# Patient Record
Sex: Male | Born: 1980 | Race: White | Hispanic: No | Marital: Single | Smoking: Current every day smoker
Health system: Southern US, Community
[De-identification: ages and names within clinical notes are randomized; demographics above are authoritative.]

## PROBLEM LIST (undated history)

## (undated) HISTORY — PX: DENTAL SURGERY: SHX609

---

## 1998-03-12 ENCOUNTER — Inpatient Hospital Stay (HOSPITAL_COMMUNITY): Admission: EM | Admit: 1998-03-12 | Discharge: 1998-03-15 | Payer: Self-pay | Admitting: Emergency Medicine

## 1998-03-15 ENCOUNTER — Encounter (HOSPITAL_COMMUNITY): Admission: RE | Admit: 1998-03-15 | Discharge: 1998-06-13 | Payer: Self-pay | Admitting: Psychiatry

## 2012-12-26 ENCOUNTER — Other Ambulatory Visit: Payer: Self-pay | Admitting: Family Medicine

## 2012-12-26 DIAGNOSIS — R51 Headache: Secondary | ICD-10-CM

## 2013-01-02 ENCOUNTER — Ambulatory Visit
Admission: RE | Admit: 2013-01-02 | Discharge: 2013-01-02 | Disposition: A | Discharge status: Home / Self Care | Payer: No Typology Code available for payment source | Source: Ambulatory Visit | Attending: Family Medicine | Admitting: Family Medicine

## 2013-01-02 DIAGNOSIS — R51 Headache: Secondary | ICD-10-CM

## 2013-01-02 MED ORDER — GADOBENATE DIMEGLUMINE 529 MG/ML IV SOLN
10.0000 mL | Freq: Once | INTRAVENOUS | Status: AC | PRN
  Administered 2013-01-02: 10 mL via INTRAVENOUS

## 2014-06-17 ENCOUNTER — Ambulatory Visit
Admission: RE | Admit: 2014-06-17 | Discharge: 2014-06-17 | Disposition: A | Discharge status: Home / Self Care | Payer: 59 | Source: Ambulatory Visit | Attending: Family Medicine | Admitting: Family Medicine

## 2014-06-17 ENCOUNTER — Other Ambulatory Visit: Payer: Self-pay | Admitting: Family Medicine

## 2014-06-17 DIAGNOSIS — M25511 Pain in right shoulder: Secondary | ICD-10-CM

## 2016-11-20 DIAGNOSIS — E7800 Pure hypercholesterolemia, unspecified: Secondary | ICD-10-CM | POA: Diagnosis not present

## 2016-11-20 DIAGNOSIS — E559 Vitamin D deficiency, unspecified: Secondary | ICD-10-CM | POA: Diagnosis not present

## 2016-11-20 DIAGNOSIS — E291 Testicular hypofunction: Secondary | ICD-10-CM | POA: Diagnosis not present

## 2016-11-20 DIAGNOSIS — E039 Hypothyroidism, unspecified: Secondary | ICD-10-CM | POA: Diagnosis not present

## 2016-11-23 DIAGNOSIS — E559 Vitamin D deficiency, unspecified: Secondary | ICD-10-CM | POA: Diagnosis not present

## 2016-11-23 DIAGNOSIS — E291 Testicular hypofunction: Secondary | ICD-10-CM | POA: Diagnosis not present

## 2016-11-23 DIAGNOSIS — E039 Hypothyroidism, unspecified: Secondary | ICD-10-CM | POA: Diagnosis not present

## 2017-05-28 DIAGNOSIS — E039 Hypothyroidism, unspecified: Secondary | ICD-10-CM | POA: Diagnosis not present

## 2017-05-28 DIAGNOSIS — E291 Testicular hypofunction: Secondary | ICD-10-CM | POA: Diagnosis not present

## 2018-09-30 DIAGNOSIS — E039 Hypothyroidism, unspecified: Secondary | ICD-10-CM | POA: Diagnosis not present

## 2018-09-30 DIAGNOSIS — E291 Testicular hypofunction: Secondary | ICD-10-CM | POA: Diagnosis not present

## 2020-02-19 ENCOUNTER — Other Ambulatory Visit: Payer: 59

## 2020-02-19 ENCOUNTER — Other Ambulatory Visit: Payer: Self-pay

## 2020-02-19 DIAGNOSIS — Z20822 Contact with and (suspected) exposure to covid-19: Secondary | ICD-10-CM

## 2020-02-21 LAB — NOVEL CORONAVIRUS, NAA: SARS-CoV-2, NAA: NOT DETECTED

## 2020-02-21 LAB — SARS-COV-2, NAA 2 DAY TAT

## 2020-06-25 ENCOUNTER — Other Ambulatory Visit: Payer: Self-pay

## 2020-06-25 ENCOUNTER — Ambulatory Visit: Payer: 59 | Admitting: Family Medicine

## 2020-11-02 ENCOUNTER — Emergency Department (HOSPITAL_COMMUNITY): Payer: 59

## 2020-11-02 ENCOUNTER — Encounter (HOSPITAL_COMMUNITY): Payer: Self-pay | Admitting: Emergency Medicine

## 2020-11-02 ENCOUNTER — Emergency Department (HOSPITAL_COMMUNITY)
Admission: EM | Admit: 2020-11-02 | Discharge: 2020-11-03 | Disposition: A | Discharge status: Home / Self Care | Payer: 59 | Attending: Emergency Medicine | Admitting: Emergency Medicine

## 2020-11-02 ENCOUNTER — Other Ambulatory Visit: Payer: Self-pay

## 2020-11-02 DIAGNOSIS — S42401A Unspecified fracture of lower end of right humerus, initial encounter for closed fracture: Secondary | ICD-10-CM | POA: Insufficient documentation

## 2020-11-02 DIAGNOSIS — S42491A Other displaced fracture of lower end of right humerus, initial encounter for closed fracture: Secondary | ICD-10-CM

## 2020-11-02 DIAGNOSIS — X58XXXA Exposure to other specified factors, initial encounter: Secondary | ICD-10-CM | POA: Insufficient documentation

## 2020-11-02 DIAGNOSIS — Y9372 Activity, wrestling: Secondary | ICD-10-CM | POA: Insufficient documentation

## 2020-11-02 DIAGNOSIS — S4991XA Unspecified injury of right shoulder and upper arm, initial encounter: Secondary | ICD-10-CM | POA: Diagnosis present

## 2020-11-02 MED ORDER — OXYCODONE-ACETAMINOPHEN 5-325 MG PO TABS
2.0000 | ORAL_TABLET | Freq: Once | ORAL | Status: AC
  Administered 2020-11-02: 2 via ORAL
  Filled 2020-11-02: qty 2

## 2020-11-02 MED ORDER — HYDROMORPHONE HCL 1 MG/ML IJ SOLN
1.0000 mg | Freq: Once | INTRAMUSCULAR | Status: AC
  Administered 2020-11-03: 1 mg via INTRAVENOUS
  Filled 2020-11-02: qty 1

## 2020-11-02 MED ORDER — ONDANSETRON HCL 4 MG/2ML IJ SOLN
4.0000 mg | Freq: Once | INTRAMUSCULAR | Status: AC
  Administered 2020-11-03: 4 mg via INTRAVENOUS
  Filled 2020-11-02: qty 2

## 2020-11-02 NOTE — ED Provider Notes (Signed)
Emergency Medicine Provider Triage Evaluation Note  Shane Middleton , a 40 y.o. male  was evaluated in triage.  Pt complains of right arm pain.  States that he broke his arm about 30 minutes ago while arm wrestling.  States the pain is in his upper arm.  Reports associated numbness and tingling.  Review of Systems  Positive: Arm pain, numbness, tingling Negative: weakness  Physical Exam  Ht 5\' 11"  (1.803 m)   Wt 92 kg   BMI 28.29 kg/m  Gen:   Awake, no distress   Resp:  Normal effort  MSK:   No visible deformity, right upper extremity TTP Other:  Intact distal pulses, intact distal sensation, brisk cap refill  Medical Decision Making  Medically screening exam initiated at 10:17 PM.  Appropriate orders placed.  was informed that the remainder of the evaluation will be completed by another provider, this initial triage assessment does not replace that evaluation, and the importance of remaining in the ED until their evaluation is complete.  X-ray, pain meds, and sling ordered.   Lahoma Crocker, PA-C 11/02/20 2219    2220, MD 11/03/20 2258

## 2020-11-02 NOTE — ED Triage Notes (Signed)
Patient reports injury to right upper arm this evening while arm wrestling with pain and swelling .

## 2020-11-02 NOTE — ED Notes (Signed)
Paged [facility] by Margo Aye

## 2020-11-02 NOTE — ED Provider Notes (Signed)
[facility] EMERGENCY DEPARTMENT Provider Note   CSN: [id number] Arrival date & time: 11/02/20  2211     History Chief Complaint  Patient presents with  . Arm Injury    Shane Middleton is a 40 y.o. male.  Patient presents to the ED with a chief complaint of right arm pain.  He states that he was arm wrestling tonight and felt a snap and thinks he broke his arm.  He complains of numbness and tingling.  He rates the pain as severe. He states the pain is worsened with palpation and movement.  Denies any treatments PTA.  The history is provided by the patient. No language interpreter was used.       History reviewed. No pertinent past medical history.  There are no problems to display for this patient.   History reviewed. No pertinent surgical history.     No family history on file.  Social History   Tobacco Use  . Smoking status: Never Smoker  . Smokeless tobacco: Never Used  Substance Use Topics  . Alcohol use: Never  . Drug use: Never    Home Medications Prior to Admission medications   Not on File    Allergies    Patient has no known allergies.  Review of Systems   Review of Systems  All other systems reviewed and are negative.   Physical Exam Updated Vital Signs BP (!) 141/82   Pulse 84   Temp (!) 97.3 F (36.3 C) (Oral)   Resp 16   Ht 5\' 11"  (1.803 m)   Wt 92 kg   SpO2 100%   BMI 28.29 kg/m   Physical Exam Nursing note and vitals reviewed.  Constitutional: Pt appears well-developed and well-nourished. No distress.  HENT:  Head: Normocephalic and atraumatic.  Eyes: Conjunctivae are normal.  Neck: Normal range of motion.  Cardiovascular: Normal rate, regular rhythm. Intact distal pulses.   Capillary refill < 3 sec.  Pulmonary/Chest: Effort normal and breath sounds normal.  Musculoskeletal:  RUE Pt exhibits TTP over the humerus.   ROM: deferred  Strength: deferred  Neurological: Pt  is alert. Coordination normal.   Sensation: 5/5 Skin: Skin is warm and dry. Pt is not diaphoretic.  No evidence of open wound or skin tenting Psychiatric: Pt has a normal mood and affect.    ED Results / Procedures / Treatments   Labs (all labs ordered are listed, but only abnormal results are displayed) Labs Reviewed - No data to display  EKG None  Radiology DG Humerus Right  Result Date: 11/02/2020 CLINICAL DATA:  Arm pain and deformity after wrestling tonight. EXAM: RIGHT HUMERUS - 2+ VIEW COMPARISON:  None. FINDINGS: Oblique fracture of the distal humeral shaft with 2.2 cm osseous displacement. No evidence of intra-articular extension at the elbow joint. Intact proximal humerus. No obvious elbow joint effusion, patient had difficulty with positioning due to pain. Soft tissue edema at the fracture site. IMPRESSION: Displaced distal humeral shaft fracture. Electronically Signed   By: 11/04/2020 M.D.   On: 11/02/2020 22:58    Procedures Procedures   Medications Ordered in ED Medications  HYDROmorphone (DILAUDID) injection 1 mg (has no administration in time range)  ondansetron (ZOFRAN) injection 4 mg (has no administration in time range)  oxyCODONE-acetaminophen (PERCOCET/ROXICET) 5-325 MG per tablet 2 tablet (2 tablets Oral Given 11/02/20 2224)    ED Course  I have reviewed the triage vital signs and the nursing notes.  Pertinent labs & imaging results that  were available during my care of the patient were reviewed by me and considered in my medical decision making (see chart for details).    MDM Rules/Calculators/A&P                          Patient presents with injury to right arm.  DDx includes, fracture, strain, or sprain.  Consultants: Dr. Aundria Rud, recommends long arm splint, sling, and elbow films.  Call office and make an appointment.  Plain films reveal right oblique humerus fx.  Pt advised to follow up with PCP and/or orthopedics. Patient given sling and splint while in ED, conservative  therapy such as RICE recommended and discussed.   Patient will be discharged home & is agreeable with above plan. Returns precautions discussed. Pt appears safe for discharge.  Final Clinical Impression(s) / ED Diagnoses Final diagnoses:  Other closed displaced fracture of distal end of right humerus, initial encounter    Rx / DC Orders ED Discharge Orders         Ordered    oxyCODONE-acetaminophen (PERCOCET) 5-325 MG tablet  Every 4 hours PRN        11/03/20 0107    ondansetron (ZOFRAN ODT) 4 MG disintegrating tablet  Every 8 hours PRN        11/03/20 0107           Roxy Horseman, PA-C 11/03/20 0107    Mesner, Barbara Cower, MD 11/03/20 0200

## 2020-11-03 MED ORDER — ONDANSETRON 4 MG PO TBDP: 4.0000 mg | ORAL_TABLET | Freq: Three times a day (TID) | ORAL | 0 refills | Status: AC | PRN

## 2020-11-03 MED ORDER — HYDROMORPHONE HCL 1 MG/ML IJ SOLN
1.0000 mg | Freq: Once | INTRAMUSCULAR | Status: AC
Start: 2020-11-03 — End: 2020-11-03
  Administered 2020-11-03: 1 mg via INTRAVENOUS
  Filled 2020-11-03: qty 1

## 2020-11-03 MED ORDER — OXYCODONE-ACETAMINOPHEN 5-325 MG PO TABS: 1.0000 | ORAL_TABLET | ORAL | 0 refills | Status: DC | PRN

## 2020-11-03 NOTE — Discharge Instructions (Signed)
Call Dr. Aundria Rud' office tomorrow and let them know that he said you needed an appointment for your broken humerus.    Take pain medications as prescribed.

## 2020-11-03 NOTE — Progress Notes (Addendum)
Orthopedic Tech Progress Note Patient Details:  Shane Middleton January 23, 1981 [id number]  Ortho Devices Type of Ortho Device: Arm sling,Post (long arm) splint Ortho Device/Splint Location: rue Ortho Device/Splint Interventions: Ordered,Application,Adjustment  pt already had arm sling on Post Interventions Patient Tolerated: Well Instructions Provided: Care of device,Adjustment of device   Trinna Post 11/03/2020, 1:04 AM

## 2020-11-04 ENCOUNTER — Other Ambulatory Visit: Payer: Self-pay

## 2020-11-04 ENCOUNTER — Encounter (HOSPITAL_COMMUNITY): Payer: Self-pay | Admitting: Orthopedic Surgery

## 2020-11-04 NOTE — Progress Notes (Signed)
PCP - [facility] - Dr. Tenny Craw Cardiologist -   Chest x-ray -  EKG -  Stress Test -  ECHO -  Cardiac Cath -    ERAS Protcol - clears 0930  COVID TEST- n/a  Anesthesia review: n/a  -------------  SDW INSTRUCTIONS:  Your procedure is scheduled on 5/27 Friday. Please report to [facility] Main Entrance "A" at 10:00 A.M., and check in at the Admitting office. Call this number if you have problems the morning of surgery: [phone]   Remember: Do not eat after midnight the night before your surgery  You may drink clear liquids until 09:30 AM the morning of your surgery.   Clear liquids allowed are: Water, Non-Citrus Juices (without pulp), Carbonated Beverages, Clear Tea, Black Coffee Only, and Gatorade   Medications to take morning of surgery with a sip of water include: anastrozole (ARIMIDEX) buPROPion (WELLBUTRIN XL) ondansetron (ZOFRAN ODT) if needed oxyCODONE-acetaminophen (PERCOCET) if needed thyroid (ARMOUR)    As of today, STOP taking any Aspirin (unless otherwise instructed by your surgeon), Aleve, Naproxen, Ibuprofen, Motrin, Advil, Goody's, BC's, all herbal medications, fish oil, and all vitamins.    The Morning of Surgery Do not wear jewelry Do not wear lotions, powders, colognes, or deodorant Men may shave face and neck. Do not bring valuables to the hospital. [facility] is not responsible for any belongings or valuables. If you are a smoker, DO NOT Smoke 24 hours prior to surgery If you wear a CPAP at night please bring your mask the morning of surgery  Remember that you must have someone to transport you home after your surgery, and remain with you for 24 hours if you are discharged the same day. Please bring cases for contacts, glasses, hearing aids, dentures or bridgework because it cannot be worn into surgery.   Patients discharged the day of surgery will not be allowed to drive home.   Please shower the NIGHT BEFORE SURGERY and the MORNING OF  SURGERY with DIAL Soap. Wear comfortable clothes the morning of surgery. Oral Hygiene is also important to reduce your risk of infection.  Remember - BRUSH YOUR TEETH THE MORNING OF SURGERY WITH YOUR REGULAR TOOTHPASTE  Patient denies shortness of breath, fever, cough and chest pain.

## 2020-11-05 ENCOUNTER — Other Ambulatory Visit: Payer: Self-pay

## 2020-11-05 ENCOUNTER — Other Ambulatory Visit (HOSPITAL_COMMUNITY): Payer: Self-pay | Admitting: Orthopedic Surgery

## 2020-11-05 ENCOUNTER — Ambulatory Visit (HOSPITAL_COMMUNITY): Payer: 59 | Admitting: Anesthesiology

## 2020-11-05 ENCOUNTER — Ambulatory Visit (HOSPITAL_COMMUNITY): Payer: 59

## 2020-11-05 ENCOUNTER — Encounter (HOSPITAL_COMMUNITY)
Admission: RE | Disposition: A | Discharge status: Home / Self Care | Payer: Self-pay | Source: Home / Self Care | Attending: Orthopedic Surgery

## 2020-11-05 ENCOUNTER — Ambulatory Visit (HOSPITAL_COMMUNITY)
Admission: RE | Admit: 2020-11-05 | Discharge: 2020-11-05 | Disposition: A | Discharge status: Home / Self Care | Payer: 59 | Attending: Orthopedic Surgery | Admitting: Orthopedic Surgery

## 2020-11-05 ENCOUNTER — Encounter (HOSPITAL_COMMUNITY): Payer: Self-pay | Admitting: Orthopedic Surgery

## 2020-11-05 DIAGNOSIS — Y9372 Activity, wrestling: Secondary | ICD-10-CM | POA: Insufficient documentation

## 2020-11-05 DIAGNOSIS — T148XXA Other injury of unspecified body region, initial encounter: Secondary | ICD-10-CM

## 2020-11-05 DIAGNOSIS — Z79899 Other long term (current) drug therapy: Secondary | ICD-10-CM | POA: Insufficient documentation

## 2020-11-05 DIAGNOSIS — Z79890 Hormone replacement therapy (postmenopausal): Secondary | ICD-10-CM | POA: Diagnosis not present

## 2020-11-05 DIAGNOSIS — Z419 Encounter for procedure for purposes other than remedying health state, unspecified: Secondary | ICD-10-CM

## 2020-11-05 DIAGNOSIS — F17210 Nicotine dependence, cigarettes, uncomplicated: Secondary | ICD-10-CM | POA: Insufficient documentation

## 2020-11-05 DIAGNOSIS — S42401A Unspecified fracture of lower end of right humerus, initial encounter for closed fracture: Secondary | ICD-10-CM | POA: Insufficient documentation

## 2020-11-05 HISTORY — PX: ORIF HUMERUS FRACTURE: SHX2126

## 2020-11-05 SURGERY — OPEN REDUCTION INTERNAL FIXATION (ORIF) HUMERAL SHAFT FRACTURE
Anesthesia: General | Site: Arm Upper | Laterality: Right

## 2020-11-05 MED ORDER — METHOCARBAMOL 500 MG PO TABS: 500.0000 mg | ORAL_TABLET | Freq: Four times a day (QID) | ORAL | 1 refills | Status: AC | PRN

## 2020-11-05 MED ORDER — MIDAZOLAM HCL 2 MG/2ML IJ SOLN
INTRAMUSCULAR | Status: AC
  Filled 2020-11-05: qty 2

## 2020-11-05 MED ORDER — VANCOMYCIN HCL 500 MG IV SOLR
INTRAVENOUS | Status: AC
  Filled 2020-11-05: qty 500

## 2020-11-05 MED ORDER — DEXMEDETOMIDINE (PRECEDEX) IN NS 20 MCG/5ML (4 MCG/ML) IV SYRINGE
PREFILLED_SYRINGE | INTRAVENOUS | Status: AC
  Filled 2020-11-05: qty 10

## 2020-11-05 MED ORDER — LACTATED RINGERS IV SOLN: INTRAVENOUS | Status: DC

## 2020-11-05 MED ORDER — MIDAZOLAM HCL 2 MG/2ML IJ SOLN
INTRAMUSCULAR | Status: DC | PRN
  Administered 2020-11-05: 2 mg via INTRAVENOUS

## 2020-11-05 MED ORDER — ONDANSETRON HCL 4 MG/2ML IJ SOLN
INTRAMUSCULAR | Status: DC | PRN
  Administered 2020-11-05: 4 mg via INTRAVENOUS

## 2020-11-05 MED ORDER — ACETAMINOPHEN 325 MG PO TABS: 325.0000 mg | ORAL_TABLET | Freq: Once | ORAL | Status: DC | PRN

## 2020-11-05 MED ORDER — FENTANYL CITRATE (PF) 100 MCG/2ML IJ SOLN: 100.0000 ug | Freq: Once | INTRAMUSCULAR | Status: AC

## 2020-11-05 MED ORDER — FENTANYL CITRATE (PF) 250 MCG/5ML IJ SOLN
INTRAMUSCULAR | Status: DC | PRN
  Administered 2020-11-05: 50 ug via INTRAVENOUS
  Administered 2020-11-05: 100 ug via INTRAVENOUS
  Administered 2020-11-05: 50 ug via INTRAVENOUS

## 2020-11-05 MED ORDER — ACETAMINOPHEN 10 MG/ML IV SOLN: 1000.0000 mg | Freq: Once | INTRAVENOUS | Status: DC | PRN

## 2020-11-05 MED ORDER — ONDANSETRON HCL 4 MG/2ML IJ SOLN
INTRAMUSCULAR | Status: AC
  Filled 2020-11-05: qty 2

## 2020-11-05 MED ORDER — DEXMEDETOMIDINE (PRECEDEX) IN NS 20 MCG/5ML (4 MCG/ML) IV SYRINGE
PREFILLED_SYRINGE | INTRAVENOUS | Status: DC | PRN
  Administered 2020-11-05 (×2): 12 ug via INTRAVENOUS
  Administered 2020-11-05 (×2): 8 ug via INTRAVENOUS

## 2020-11-05 MED ORDER — FENTANYL CITRATE (PF) 250 MCG/5ML IJ SOLN
INTRAMUSCULAR | Status: AC
  Filled 2020-11-05: qty 5

## 2020-11-05 MED ORDER — DEXAMETHASONE SODIUM PHOSPHATE 10 MG/ML IJ SOLN
INTRAMUSCULAR | Status: DC | PRN
  Administered 2020-11-05: 10 mg via INTRAVENOUS

## 2020-11-05 MED ORDER — AMISULPRIDE (ANTIEMETIC) 5 MG/2ML IV SOLN: 10.0000 mg | Freq: Once | INTRAVENOUS | Status: DC | PRN

## 2020-11-05 MED ORDER — VANCOMYCIN HCL 1000 MG IV SOLR
INTRAVENOUS | Status: AC
  Filled 2020-11-05: qty 1000

## 2020-11-05 MED ORDER — DEXAMETHASONE SODIUM PHOSPHATE 10 MG/ML IJ SOLN
INTRAMUSCULAR | Status: AC
  Filled 2020-11-05: qty 1

## 2020-11-05 MED ORDER — ONDANSETRON 4 MG PO TBDP: 4.0000 mg | ORAL_TABLET | Freq: Three times a day (TID) | ORAL | 0 refills | Status: AC | PRN

## 2020-11-05 MED ORDER — PHENYLEPHRINE 40 MCG/ML (10ML) SYRINGE FOR IV PUSH (FOR BLOOD PRESSURE SUPPORT)
PREFILLED_SYRINGE | INTRAVENOUS | Status: DC | PRN
  Administered 2020-11-05 (×2): 40 ug via INTRAVENOUS

## 2020-11-05 MED ORDER — FENTANYL CITRATE (PF) 100 MCG/2ML IJ SOLN
INTRAMUSCULAR | Status: AC
  Administered 2020-11-05: 100 ug via INTRAVENOUS
  Filled 2020-11-05: qty 2

## 2020-11-05 MED ORDER — ROCURONIUM BROMIDE 10 MG/ML (PF) SYRINGE
PREFILLED_SYRINGE | INTRAVENOUS | Status: DC | PRN
  Administered 2020-11-05: 60 mg via INTRAVENOUS
  Administered 2020-11-05: 10 mg via INTRAVENOUS

## 2020-11-05 MED ORDER — VANCOMYCIN HCL 1000 MG IV SOLR
INTRAVENOUS | Status: DC | PRN
  Administered 2020-11-05: 1000 mg

## 2020-11-05 MED ORDER — ACETAMINOPHEN 160 MG/5ML PO SOLN
325.0000 mg | Freq: Once | ORAL | Status: DC | PRN
Start: 2020-11-05 — End: 2020-11-07

## 2020-11-05 MED ORDER — BUPIVACAINE LIPOSOME 1.3 % IJ SUSP
INTRAMUSCULAR | Status: DC | PRN
  Administered 2020-11-05: 10 mL via PERINEURAL

## 2020-11-05 MED ORDER — ORAL CARE MOUTH RINSE: 15.0000 mL | Freq: Once | OROMUCOSAL | Status: AC

## 2020-11-05 MED ORDER — LIDOCAINE 2% (20 MG/ML) 5 ML SYRINGE
INTRAMUSCULAR | Status: DC | PRN
  Administered 2020-11-05: 40 mg via INTRAVENOUS

## 2020-11-05 MED ORDER — HYDROMORPHONE HCL 1 MG/ML IJ SOLN: 0.2500 mg | INTRAMUSCULAR | Status: DC | PRN

## 2020-11-05 MED ORDER — HYDROCODONE-ACETAMINOPHEN 7.5-325 MG PO TABS: 1.0000 | ORAL_TABLET | Freq: Four times a day (QID) | ORAL | 0 refills | Status: AC | PRN

## 2020-11-05 MED ORDER — ROCURONIUM BROMIDE 10 MG/ML (PF) SYRINGE
PREFILLED_SYRINGE | INTRAVENOUS | Status: AC
  Filled 2020-11-05: qty 10

## 2020-11-05 MED ORDER — MEPERIDINE HCL 25 MG/ML IJ SOLN: 6.2500 mg | INTRAMUSCULAR | Status: DC | PRN

## 2020-11-05 MED ORDER — LIDOCAINE 2% (20 MG/ML) 5 ML SYRINGE
INTRAMUSCULAR | Status: AC
  Filled 2020-11-05: qty 10

## 2020-11-05 MED ORDER — SUGAMMADEX SODIUM 200 MG/2ML IV SOLN
INTRAVENOUS | Status: DC | PRN
  Administered 2020-11-05: 200 mg via INTRAVENOUS

## 2020-11-05 MED ORDER — PROPOFOL 10 MG/ML IV BOLUS
INTRAVENOUS | Status: AC
  Filled 2020-11-05: qty 20

## 2020-11-05 MED ORDER — BUPIVACAINE HCL (PF) 0.5 % IJ SOLN
INTRAMUSCULAR | Status: DC | PRN
  Administered 2020-11-05: 15 mL via PERINEURAL

## 2020-11-05 MED ORDER — CEFAZOLIN SODIUM-DEXTROSE 2-4 GM/100ML-% IV SOLN
2.0000 g | INTRAVENOUS | Status: AC
Start: 2020-11-05 — End: 2020-11-05
  Administered 2020-11-05: 2 g via INTRAVENOUS
  Filled 2020-11-05: qty 100

## 2020-11-05 MED ORDER — CHLORHEXIDINE GLUCONATE 0.12 % MT SOLN
15.0000 mL | Freq: Once | OROMUCOSAL | Status: AC
  Administered 2020-11-05: 15 mL via OROMUCOSAL
  Filled 2020-11-05: qty 15

## 2020-11-05 MED ORDER — TRANEXAMIC ACID-NACL 1000-0.7 MG/100ML-% IV SOLN
1000.0000 mg | INTRAVENOUS | Status: AC
  Administered 2020-11-05: 1000 mg via INTRAVENOUS
  Filled 2020-11-05: qty 100

## 2020-11-05 MED ORDER — 0.9 % SODIUM CHLORIDE (POUR BTL) OPTIME
TOPICAL | Status: DC | PRN
  Administered 2020-11-05: 1000 mL

## 2020-11-05 MED ORDER — MIDAZOLAM HCL 2 MG/2ML IJ SOLN
INTRAMUSCULAR | Status: AC
  Administered 2020-11-05: 2 mg via INTRAVENOUS
  Filled 2020-11-05: qty 2

## 2020-11-05 MED ORDER — PROMETHAZINE HCL 25 MG/ML IJ SOLN: 6.2500 mg | INTRAMUSCULAR | Status: DC | PRN

## 2020-11-05 MED ORDER — MIDAZOLAM HCL 2 MG/2ML IJ SOLN: 2.0000 mg | Freq: Once | INTRAMUSCULAR | Status: AC

## 2020-11-05 SURGICAL SUPPLY — 67 items
BIT DRILL QC 2X140 (BIT) ×2 IMPLANT
BIT DRILL QC SFS 2.5X170 (BIT) ×2 IMPLANT
BNDG ELASTIC 4X5.8 VLCR STR LF (GAUZE/BANDAGES/DRESSINGS) ×2 IMPLANT
BNDG ELASTIC 6X5.8 VLCR STR LF (GAUZE/BANDAGES/DRESSINGS) ×2 IMPLANT
COVER SURGICAL LIGHT HANDLE (MISCELLANEOUS) ×2 IMPLANT
COVER WAND RF STERILE (DRAPES) ×2 IMPLANT
DRAPE C-ARM 42X72 X-RAY (DRAPES) ×2 IMPLANT
DRAPE INCISE IOBAN 66X45 STRL (DRAPES) ×2 IMPLANT
DRAPE ORTHO SPLIT 77X108 STRL (DRAPES) ×4
DRAPE SURG ORHT 6 SPLT 77X108 (DRAPES) ×2 IMPLANT
DRAPE U-SHAPE 47X51 STRL (DRAPES) ×2 IMPLANT
DRSG AQUACEL AG ADV 3.5X 4 (GAUZE/BANDAGES/DRESSINGS) IMPLANT
DRSG AQUACEL AG ADV 3.5X 6 (GAUZE/BANDAGES/DRESSINGS) IMPLANT
DRSG PAD ABDOMINAL 8X10 ST (GAUZE/BANDAGES/DRESSINGS) ×2 IMPLANT
DURAPREP 26ML APPLICATOR (WOUND CARE) ×2 IMPLANT
ELECT CAUTERY BLADE 6.4 (BLADE) ×2 IMPLANT
ELECT REM PT RETURN 9FT ADLT (ELECTROSURGICAL) ×2
ELECTRODE REM PT RTRN 9FT ADLT (ELECTROSURGICAL) ×1 IMPLANT
GAUZE SPONGE 4X4 12PLY STRL (GAUZE/BANDAGES/DRESSINGS) ×2 IMPLANT
GLOVE BIO SURGEON STRL SZ7.5 (GLOVE) ×4 IMPLANT
GLOVE SRG 8 PF TXTR STRL LF DI (GLOVE) ×2 IMPLANT
GLOVE SURG UNDER POLY LF SZ8 (GLOVE) ×4
GOWN STRL REUS W/ TWL LRG LVL3 (GOWN DISPOSABLE) ×1 IMPLANT
GOWN STRL REUS W/ TWL XL LVL3 (GOWN DISPOSABLE) ×2 IMPLANT
GOWN STRL REUS W/TWL LRG LVL3 (GOWN DISPOSABLE) ×2
GOWN STRL REUS W/TWL XL LVL3 (GOWN DISPOSABLE) ×4
K-WIRE 1.6X150 (WIRE) ×2
KIT BASIN OR (CUSTOM PROCEDURE TRAY) ×2 IMPLANT
KIT TURNOVER KIT B (KITS) ×2 IMPLANT
KWIRE 1.6X150 (WIRE) ×1 IMPLANT
MANIFOLD NEPTUNE II (INSTRUMENTS) ×2 IMPLANT
NS IRRIG 1000ML POUR BTL (IV SOLUTION) ×2 IMPLANT
PACK SHOULDER (CUSTOM PROCEDURE TRAY) ×2 IMPLANT
PAD ARMBOARD 7.5X6 YLW CONV (MISCELLANEOUS) ×4 IMPLANT
PAD CAST 3X4 CTTN HI CHSV (CAST SUPPLIES) ×1 IMPLANT
PADDING CAST COTTON 3X4 STRL (CAST SUPPLIES) ×2
PROS LCP PLATE 12 163M (Plate) ×2 IMPLANT
PROSTHESIS LCP PLATE 12 163M (Plate) ×1 IMPLANT
SCREW CORTEX 3.5 24MM (Screw) ×1 IMPLANT
SCREW CORTEX 3.5 26MM (Screw) ×2 IMPLANT
SCREW CORTEX 3.5 28MM (Screw) ×3 IMPLANT
SCREW CORTEX 3.5 30MM (Screw) ×3 IMPLANT
SCREW LOCK CORT ST 3.5X24 (Screw) ×1 IMPLANT
SCREW LOCK CORT ST 3.5X26 (Screw) ×2 IMPLANT
SCREW LOCK CORT ST 3.5X28 (Screw) ×3 IMPLANT
SCREW LOCK CORT ST 3.5X30 (Screw) ×3 IMPLANT
SCREW METAPHYSEAL 2.7X24MM (Screw) ×2 IMPLANT
SCREW SELF-TAP 2.7X26MM (Screw) ×4 IMPLANT
SLING ARM FOAM STRAP LRG (SOFTGOODS) IMPLANT
SLING ARM FOAM STRAP MED (SOFTGOODS) IMPLANT
SPONGE LAP 18X18 RF (DISPOSABLE) IMPLANT
SPONGE LAP 4X18 RFD (DISPOSABLE) IMPLANT
STRIP CLOSURE SKIN 1/2X4 (GAUZE/BANDAGES/DRESSINGS) ×2 IMPLANT
SUCTION FRAZIER HANDLE 10FR (MISCELLANEOUS) ×2
SUCTION TUBE FRAZIER 10FR DISP (MISCELLANEOUS) ×1 IMPLANT
SUT FIBERWIRE #2 38 T-5 BLUE (SUTURE)
SUT MNCRL AB 3-0 PS2 18 (SUTURE) ×2 IMPLANT
SUT MNCRL AB 4-0 PS2 18 (SUTURE) IMPLANT
SUT VIC AB 2-0 CT1 27 (SUTURE) ×6
SUT VIC AB 2-0 CT1 TAPERPNT 27 (SUTURE) ×3 IMPLANT
SUTURE FIBERWR #2 38 T-5 BLUE (SUTURE) IMPLANT
SYR BULB IRRIG 60ML STRL (SYRINGE) ×2 IMPLANT
SYR CONTROL 10ML LL (SYRINGE) IMPLANT
TOWEL GREEN STERILE (TOWEL DISPOSABLE) ×2 IMPLANT
TOWEL GREEN STERILE FF (TOWEL DISPOSABLE) ×2 IMPLANT
WATER STERILE IRR 1000ML POUR (IV SOLUTION) ×2 IMPLANT
YANKAUER SUCT BULB TIP NO VENT (SUCTIONS) ×2 IMPLANT

## 2020-11-05 NOTE — Brief Op Note (Signed)
11/05/2020  2:46 PM  PATIENT:  Lahoma Crocker  40 y.o. male  PRE-OPERATIVE DIAGNOSIS:  Right humberus shaft fracture  POST-OPERATIVE DIAGNOSIS:  Right humberus shaft fracture  PROCEDURE:  Procedure(s) with comments: OPEN REDUCTION INTERNAL FIXATION (ORIF) HUMERAL SHAFT FRACTURE (Right) - 2 hrs  SURGEON:  Surgeon(s) and Role:    * Aundria Rud, Noah Delaine, MD - Primary    * Haddix, Gillie Manners, MD - Assisting  PHYSICIAN ASSISTANT: Dion Saucier, PA-C  ANESTHESIA:   regional and general  EBL:  50 mL   BLOOD ADMINISTERED:none  DRAINS: none   LOCAL MEDICATIONS USED:  NONE  SPECIMEN:  No Specimen  DISPOSITION OF SPECIMEN:  N/A  COUNTS:  YES  TOURNIQUET:  * No tourniquets in log *  DICTATION: .Note written in EPIC  PLAN OF CARE: Discharge to home after PACU  PATIENT DISPOSITION:  PACU - hemodynamically stable.   Delay start of Pharmacological VTE agent (>24hrs) due to surgical blood loss or risk of bleeding: not applicable

## 2020-11-05 NOTE — Op Note (Addendum)
11/05/2020  2:53 PM  PATIENT:  Shane Middleton    PRE-OPERATIVE DIAGNOSIS:  Right humerus shaft fracture  POST-OPERATIVE DIAGNOSIS:  Same  PROCEDURE:  OPEN REDUCTION INTERNAL FIXATION (ORIF) HUMERAL SHAFT FRACTURE  SURGEON:  Yolonda Kida, MD  PHYSICIAN ASSISTANT: Dion Saucier, PA-C, present and scrubbed throughout the case, critical for completion in a timely fashion, and for retraction, instrumentation, and closure.  ANESTHESIA:   General  PREOPERATIVE INDICATIONS:  Shane Middleton is a  40 y.o. male with a diagnosis of Right humerus shaft fracture who elected for surgical management.    The risks benefits and alternatives were discussed with the patient including but not limited to the risks of nonoperative treatment, versus surgical intervention including infection, bleeding, nerve injury, malunion, nonunion, the need for revision surgery, hardware prominence, hardware failure, the need for hardware removal, blood clots, cardiopulmonary complications, conversion to arthroplasty, morbidity, mortality, among others, and they were willing to proceed.  Predicted outcome is good, although there will be at least a six to nine month expected recovery.   OPERATIVE IMPLANTS:  Synthes 3.5 LCP plate 12 hole 3 metaphyseal 2.7 mm interfragment screws  OPERATIVE FINDINGS: Displaced distal one third humerus  UNIQUE ASPECTS OF THE CASE:   Significant swelling and intramuscular hematoma of the brachialis.  This was a simple fracture pattern with a short oblique segment in the distal third of the humeral shaft.  This was extra-articular.  Bone quality was good.  OPERATIVE PROCEDURE: The patient was brought to the operating room and placed in the supine position. General anesthesia was administered. IV antibiotics were given. He was placed with the right upper extremity on the arm table.  This was a radiolucent table.  We began with a anterior lateral approach to the humeral shaft.  Skin  incision was taken down just lateral to the midline.  Dissection carried down to the level of the biceps fascia.  The cephalic vein was dissected and transposed the lateral.  We then entered the deep space between the bicep muscle fascia and brachialis muscle fascia.  The bicep was retracted medially with blunt retractors.  The musculocutaneous nerve was noted traversing along the undersurface of the biceps muscle.  This was protected throughout the case.    We then divided the brachialis in line with muscle fibers at the junction of the middle third and lateral third.  This exposed the fracture site.  I exposed the fracture site, and placed deep retractors.  Fracture hematoma was gently debrided with saline lavage and finger dissection.  Bone holding clamps were placed proximal and distal.  Reduction maneuver was undertaken and 2 point-to-point clamps were used to hold the reduction provisionally.  We then used intraoperative fluoroscopy to confirm adequate reduction of the spiral fracture.  Next, we placed 3 interfragmentary screws noting good compression with our clamps.  These were placed sequentially with 2.7 mm metaphyseal screws.  These had excellent purchase.  We then moved to select a plate placed anteriorly along this distal humerus fracture.  We chose a 12 hole 3.5 mm LCP plate.  We used fluoroscopy on the AP view to angle the plate into the lateral column distal so that we can get bicortical screws there and avoid the olecranon fossa.  Once we had adequate placement of the plate we then began to place our screws.  We placed 4 distal screws to the fracture site which were all bicortical, 3.5 millimeter screws with good compression.  Proximally, we placed a 5 bicortical 3.5  mm cortical screws.  Final x-rays were taken intraoperatively with fluoroscopy.  AP and lateral.  These confirm adequate reduction and placement of intraoperative hardware.  The wound was then copiously irrigated with normal  saline.  The biceps was allowed to slide lateral and cover the deep plate.  Fascial closure was not undertaken to allow for pretty moderate swelling.  We then closed the skin with 2-0 Vicryl and 3-0 subcuticular Monocryl and Steri-Strips.  Standard sterile dressing was applied and the arm was placed in a sling.  No intraoperative complications were encountered.  Patient was awakened from general anesthetic in stable condition and transported to PACU.  All counts were correct x2.   Disposition:  Mr. Hollibaugh will be nonweightbearing to the right upper extremity for at least 6 weeks in regards to heavy lifting.  He may begin activities of daily living with the right arm starting now.  Sling will be worn for the first 2 weeks.  He can begin elbow, hand, and wrist range of motion as able.  Discharge home today from PACU.  Follow-up with me in 2 weeks with 2 x-ray views of the right humerus.

## 2020-11-05 NOTE — Discharge Instructions (Signed)
Orthopedic discharge instructions:  -Maintain postoperative bandages for 3 days.  He may remove these on the third day and begin showering.  He should then replace a dry dressing over your incision and wrapped back up with the Ace wrap until your follow-up appointment in 2 weeks.  Change the dressing once per day after the third day.  -Maintain your arm in the sling at all times unless doing elbow range of motion and/or shoulder range of motion and/or hand and wrist range of motion.  No lifting more than 1 pound with the right arm at this time.  -Apply ice to the right arm along the biceps area for 20 to 30 minutes out of each hour.  Do this around-the-clock as frequently as possible throughout the day.  -For mild to moderate pain you can use Tylenol and Advil in alternating fashion around-the-clock.  You should then use your hydrocodone as needed for breakthrough pain.  -Follow-up with Dr. Aundria Rud in 2 weeks.

## 2020-11-05 NOTE — Anesthesia Procedure Notes (Signed)
Procedure Name: Intubation Date/Time: 11/05/2020 12:57 PM Performed by: Marena Chancy, CRNA Pre-anesthesia Checklist: Patient identified, Emergency Drugs available, Suction available and Patient being monitored Patient Re-evaluated:Patient Re-evaluated prior to induction Oxygen Delivery Method: Circle System Utilized Preoxygenation: Pre-oxygenation with 100% oxygen Induction Type: IV induction Ventilation: Mask ventilation with difficulty, Two handed mask ventilation required and Oral airway inserted - appropriate to patient size Laryngoscope Size: Hyacinth Meeker and 2 Grade View: Grade I Tube type: Oral Tube size: 7.5 mm Number of attempts: 1 Airway Equipment and Method: Stylet and Oral airway Placement Confirmation: ETT inserted through vocal cords under direct vision,  positive ETCO2 and breath sounds checked- equal and bilateral Tube secured with: Tape Dental Injury: Teeth and Oropharynx as per pre-operative assessment  Comments: Good view with a substantial head lift and slight cricoid pressure

## 2020-11-05 NOTE — Anesthesia Postprocedure Evaluation (Signed)
Anesthesia Post Note  Patient: Shane Middleton  Procedure(s) Performed: OPEN REDUCTION INTERNAL FIXATION (ORIF) HUMERAL SHAFT FRACTURE (Right Arm Upper)     Patient location during evaluation: PACU Anesthesia Type: General Level of consciousness: awake and alert, patient cooperative and oriented Pain management: pain level controlled Vital Signs Assessment: post-procedure vital signs reviewed and stable Respiratory status: spontaneous breathing, nonlabored ventilation and respiratory function stable Cardiovascular status: blood pressure returned to baseline and stable Postop Assessment: no apparent nausea or vomiting, able to ambulate and adequate PO intake Anesthetic complications: no   No complications documented.  Last Vitals:  Vitals:   11/05/20 1540 11/05/20 1555  BP: (!) 166/80 (!) 154/86  Pulse: 88 85  Resp: (!) 21 (!) 2  Temp:  (!) 36.2 C  SpO2: 94% 94%    Last Pain:  Vitals:   11/05/20 1555  TempSrc:   PainSc: 0-No pain                 Adan Baehr,E. Harlis Champoux

## 2020-11-05 NOTE — Transfer of Care (Signed)
Immediate Anesthesia Transfer of Care Note  Patient: Shane Middleton  Procedure(s) Performed: OPEN REDUCTION INTERNAL FIXATION (ORIF) HUMERAL SHAFT FRACTURE (Right Arm Upper)  Patient Location: PACU  Anesthesia Type:GA combined with regional for post-op pain  Level of Consciousness: drowsy  Airway & Oxygen Therapy: Patient Spontanous Breathing  Post-op Assessment: Report given to RN and Post -op Vital signs reviewed and stable  Post vital signs: Reviewed and stable  Last Vitals:  Vitals Value Taken Time  BP    Temp    Pulse 90 11/05/20 1527  Resp 18 11/05/20 1527  SpO2 93 % 11/05/20 1527  Vitals shown include unvalidated device data.  Last Pain:  Vitals:   11/05/20 1008  TempSrc: Oral         Complications: No complications documented.

## 2020-11-05 NOTE — Anesthesia Preprocedure Evaluation (Addendum)
Anesthesia Evaluation  Patient identified by MRN, date of birth, ID band Patient awake    Reviewed: Allergy & Precautions, NPO status , Patient's Chart, lab work & pertinent test results  Airway Mallampati: II  TM Distance: >3 FB Neck ROM: Full    Dental  (+) Teeth Intact, Dental Advisory Given   Pulmonary Current SmokerPatient did not abstain from smoking.,    breath sounds clear to auscultation       Cardiovascular negative cardio ROS   Rhythm:Regular Rate:Normal     Neuro/Psych negative neurological ROS  negative psych ROS   GI/Hepatic negative GI ROS, Neg liver ROS,   Endo/Other  negative endocrine ROS  Renal/GU negative Renal ROS     Musculoskeletal negative musculoskeletal ROS (+)   Abdominal Normal abdominal exam  (+)   Peds  Hematology negative hematology ROS (+)   Anesthesia Other Findings   Reproductive/Obstetrics                            Anesthesia Physical Anesthesia Plan  ASA: II  Anesthesia Plan: General   Post-op Pain Management: GA combined w/ Regional for post-op pain   Induction: Intravenous  PONV Risk Score and Plan: 2 and Ondansetron, Dexamethasone and Midazolam  Airway Management Planned: Oral ETT  Additional Equipment: None  Intra-op Plan:   Post-operative Plan: Extubation in OR  Informed Consent: I have reviewed the patients History and Physical, chart, labs and discussed the procedure including the risks, benefits and alternatives for the proposed anesthesia with the patient or authorized representative who has indicated his/her understanding and acceptance.     Dental advisory given  Plan Discussed with: CRNA  Anesthesia Plan Comments:        Anesthesia Quick Evaluation

## 2020-11-05 NOTE — Anesthesia Procedure Notes (Signed)
Anesthesia Regional Block: Interscalene brachial plexus block   Pre-Anesthetic Checklist: ,, timeout performed, Correct Patient, Correct Site, Correct Laterality, Correct Procedure, Correct Position, site marked, Risks and benefits discussed,  Surgical consent,  Pre-op evaluation,  At surgeon's request and post-op pain management  Laterality: Right  Prep: chloraprep       Needles:  Injection technique: Single-shot  Needle Type: Echogenic Stimulator Needle     Needle Length: 9cm  Needle Gauge: 21     Additional Needles:   Procedures:,,,, ultrasound used (permanent image in chart),,,,  Narrative:  Start time: 11/05/2020 11:25 AM End time: 11/05/2020 11:30 AM Injection made incrementally with aspirations every 5 mL.  Performed by: Personally  Anesthesiologist: Shelton Silvas, MD  Additional Notes: Patient tolerated the procedure well. Local anesthetic introduced in an incremental fashion under minimal resistance after negative aspirations. No paresthesias were elicited. After completion of the procedure, no acute issues were identified and patient continued to be monitored by RN.
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
## 2020-11-09 ENCOUNTER — Encounter (HOSPITAL_COMMUNITY): Payer: Self-pay | Admitting: Orthopedic Surgery

## 2021-09-17 IMAGING — CR DG HUMERUS 2V *R*
2 series · 2 of 2 positions shown · non-contrast
Comparison: None.

CLINICAL DATA: Arm pain and deformity after wrestling tonight.

EXAM:
RIGHT HUMERUS - 2+ VIEW

[humerus ap]
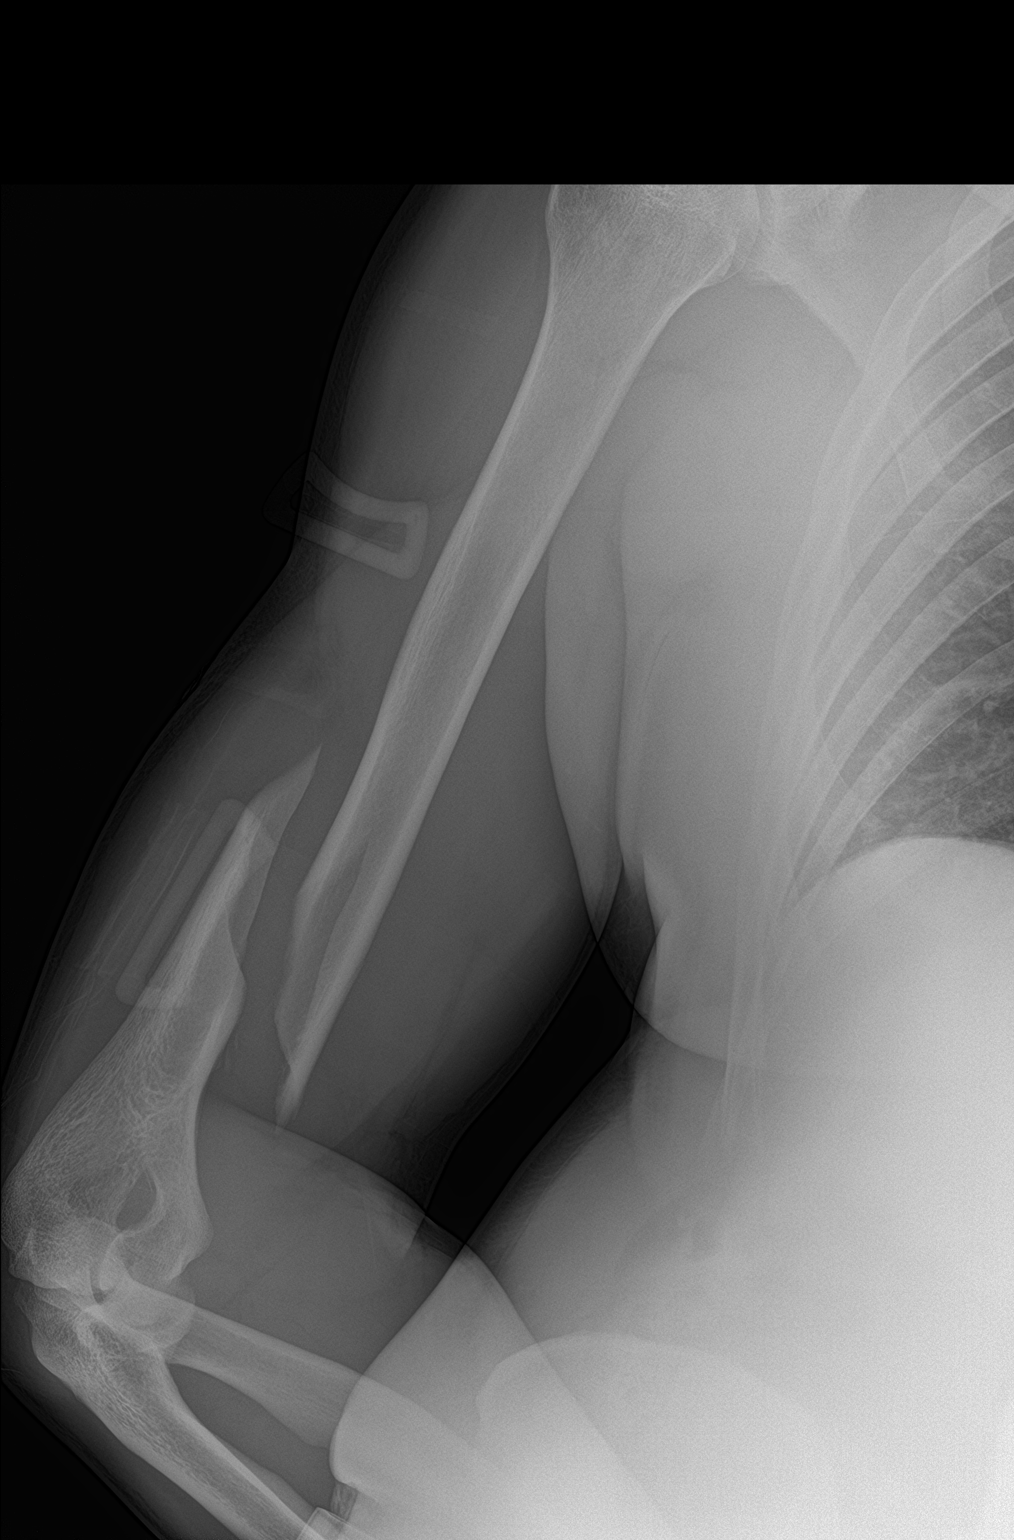

[humerus lat]
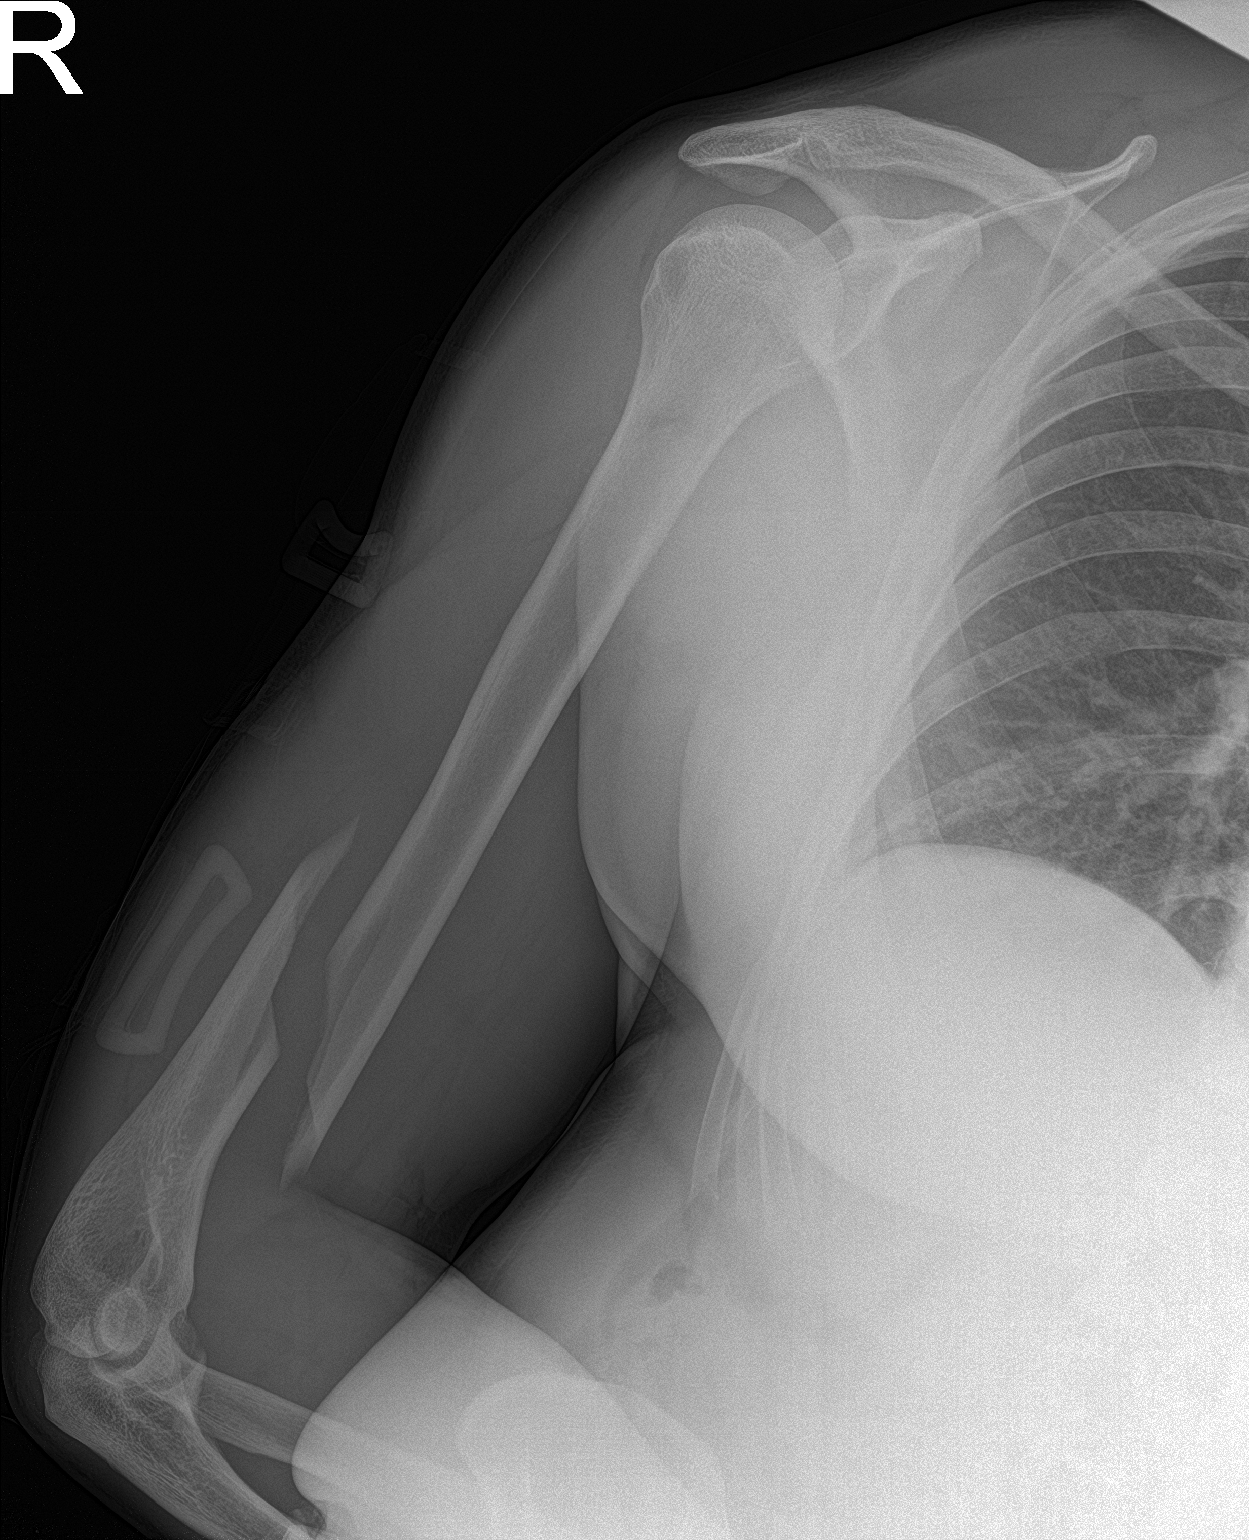

[2 of 2 positions shown; findings below may reference images not displayed]

FINDINGS: Oblique fracture of the distal humeral shaft with 2.2 cm osseous
displacement. No evidence of intra-articular extension at the elbow
joint. Intact proximal humerus. No obvious elbow joint effusion,
patient had difficulty with positioning due to pain. Soft tissue
edema at the fracture site.
IMPRESSION: Displaced distal humeral shaft fracture.

## 2021-09-20 IMAGING — RF DG C-ARM 1-60 MIN
1 series · 6 of 6 positions shown · non-contrast
Comparison: Preoperative radiograph 11/02/2020

CLINICAL DATA: ORIF right humerus.

EXAM:
RIGHT HUMERUS - 2+ VIEW; DG C-ARM 1-60 MIN

[Series 1: run · 6 of 6 slices shown]
[im 1/6]
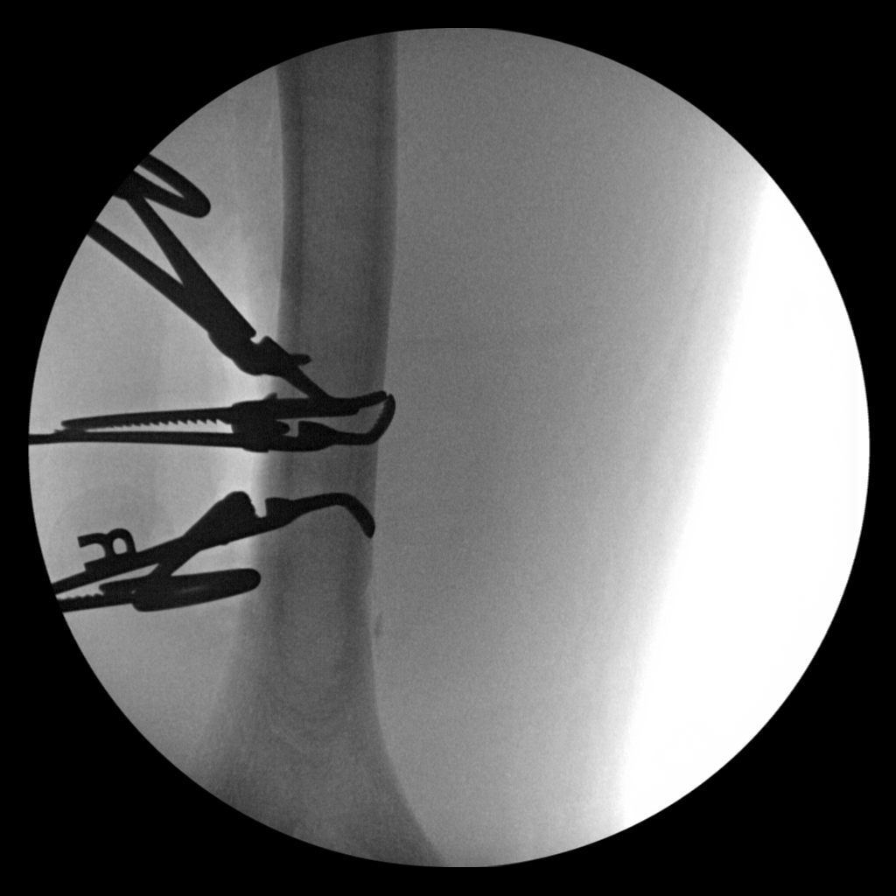
[im 2/6]
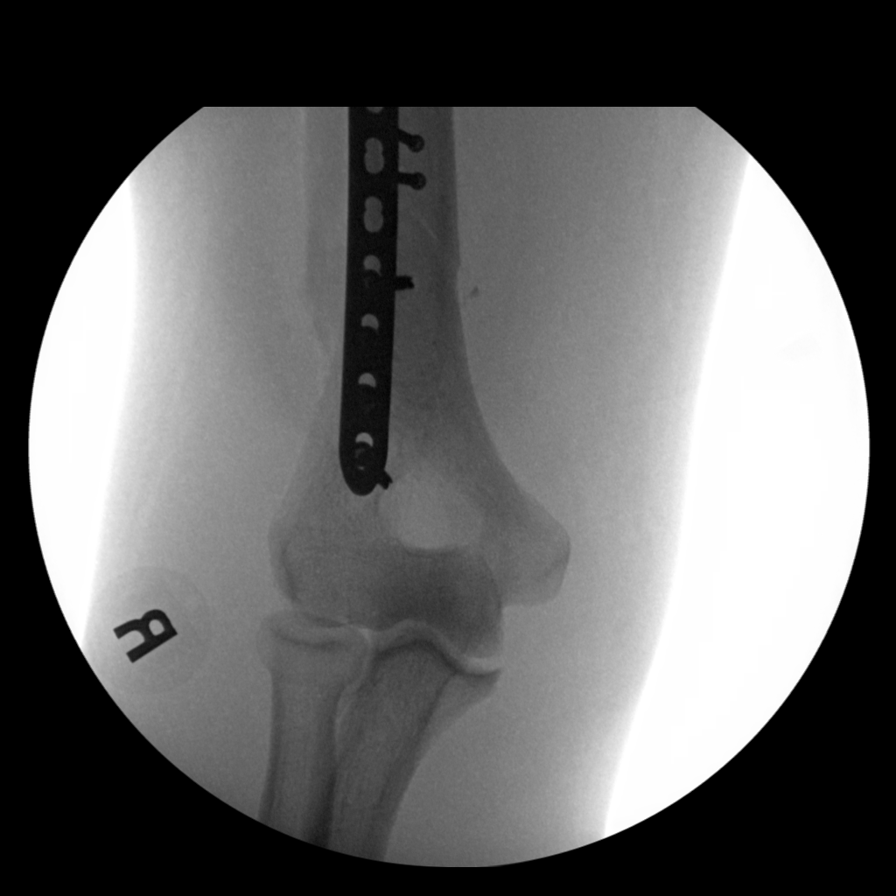
[im 3/6]
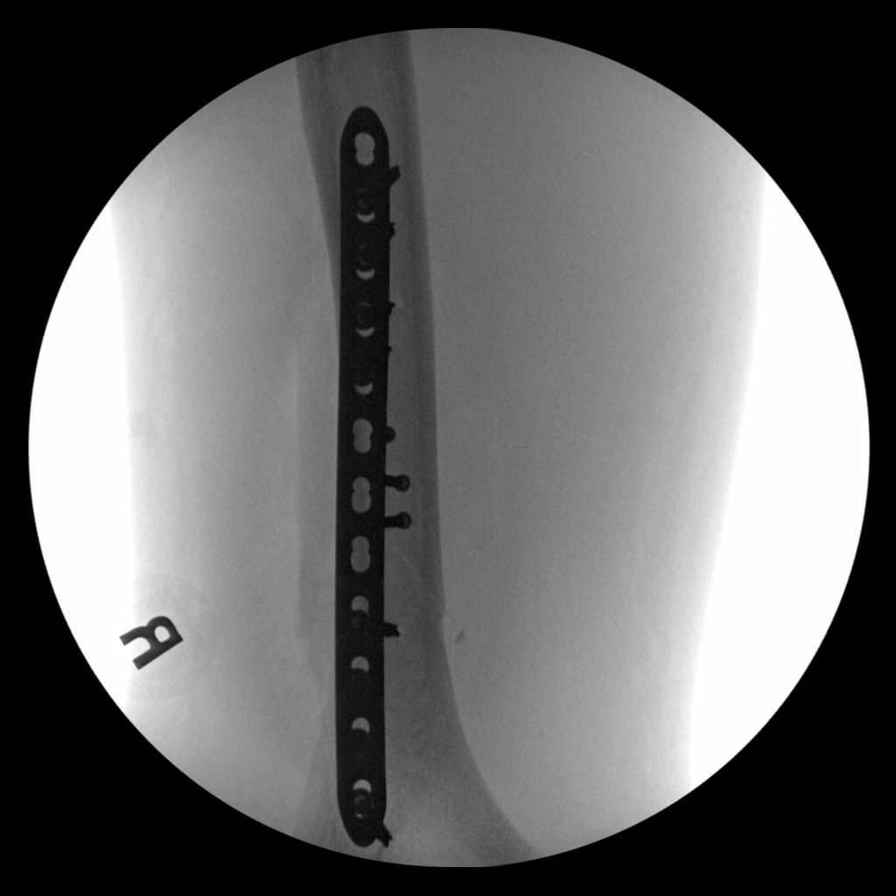
[im 4/6]
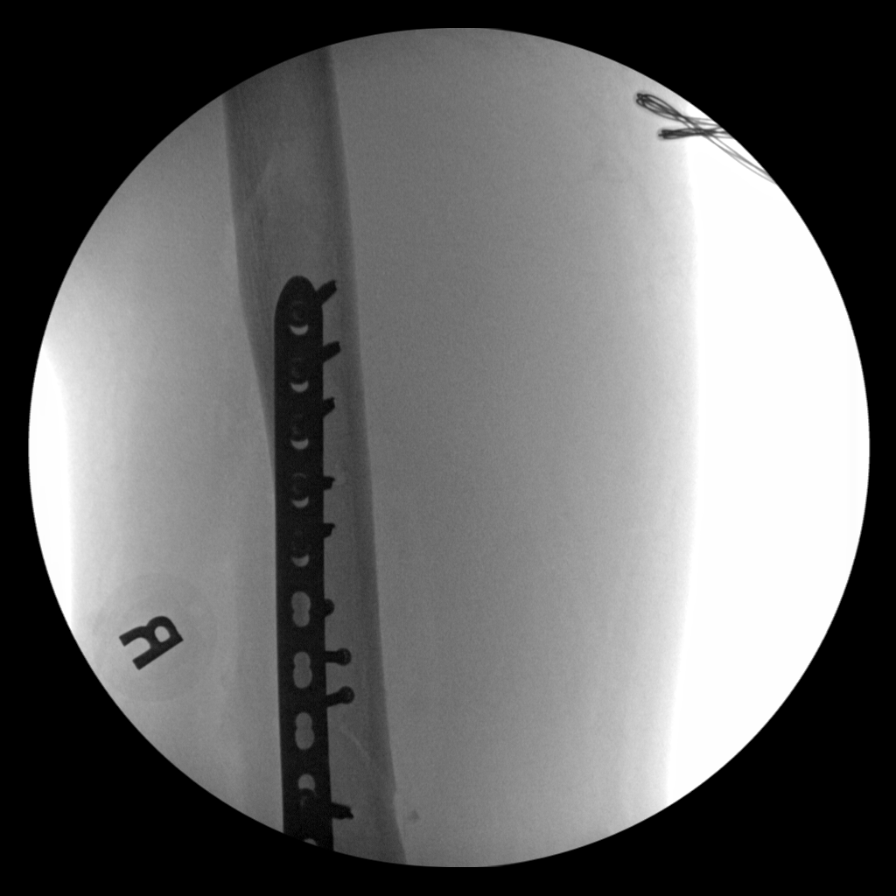
[im 5/6]
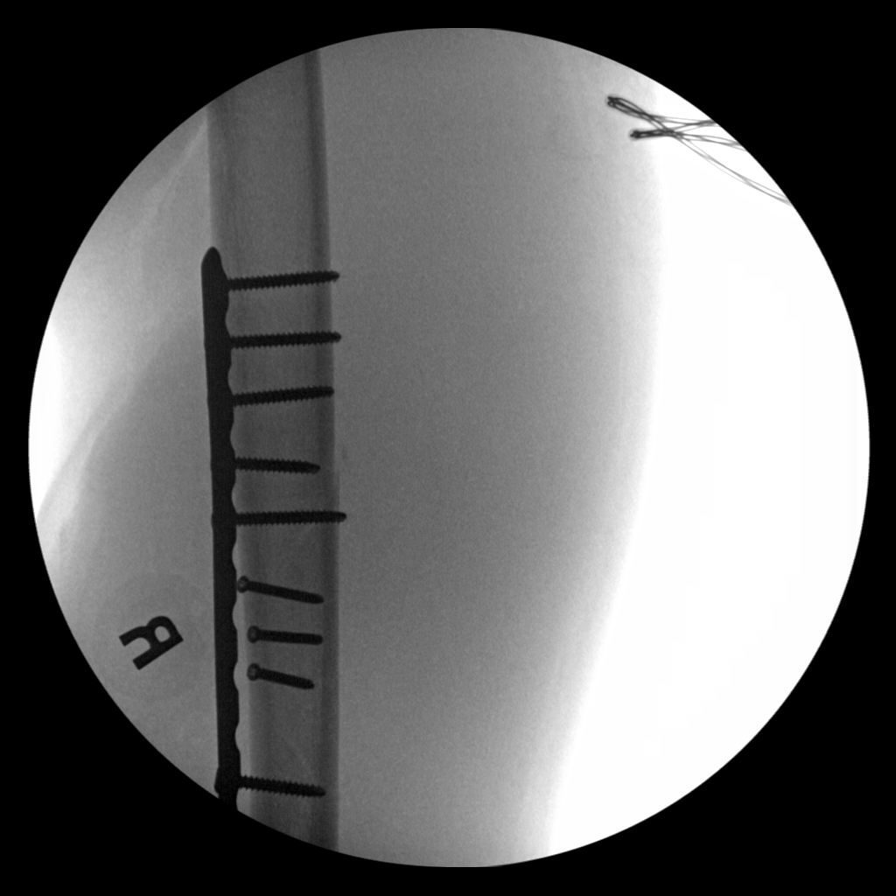
[im 6/6]
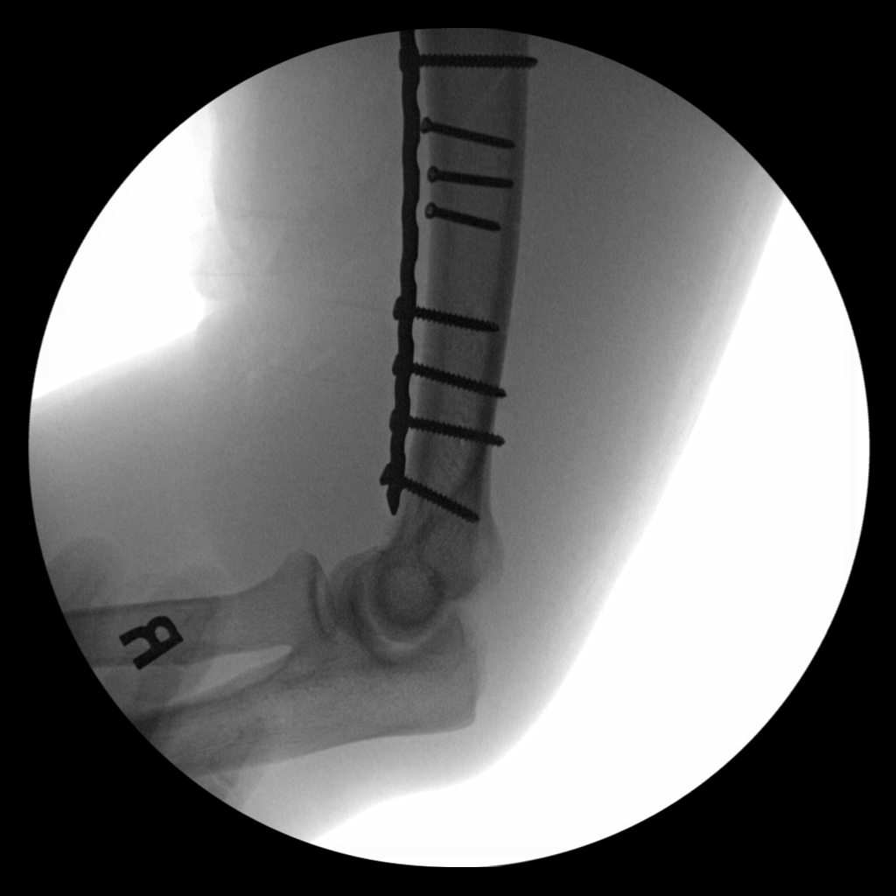

[6 of 6 positions shown; findings below may reference images not displayed]

FINDINGS: Six fluoroscopic spot views of the right humerus obtained in frontal
and lateral projections. Plate and screw fixation is well as inter
fragmentary screws traverse displaced distal humerus fracture.
Fluoroscopy time 22 seconds. Dose 1.01 mGy.
IMPRESSION: Intraoperative fluoroscopy during ORIF of displaced distal humerus
fracture.

## 2022-05-02 ENCOUNTER — Emergency Department (HOSPITAL_BASED_OUTPATIENT_CLINIC_OR_DEPARTMENT_OTHER)
Admission: EM | Admit: 2022-05-02 | Discharge: 2022-05-02 | Disposition: A | Discharge status: Home / Self Care | Payer: 59 | Attending: Emergency Medicine | Admitting: Emergency Medicine

## 2022-05-02 ENCOUNTER — Emergency Department (HOSPITAL_BASED_OUTPATIENT_CLINIC_OR_DEPARTMENT_OTHER): Payer: 59 | Admitting: Radiology

## 2022-05-02 ENCOUNTER — Encounter (HOSPITAL_BASED_OUTPATIENT_CLINIC_OR_DEPARTMENT_OTHER): Payer: Self-pay

## 2022-05-02 ENCOUNTER — Other Ambulatory Visit: Payer: Self-pay

## 2022-05-02 DIAGNOSIS — Y9301 Activity, walking, marching and hiking: Secondary | ICD-10-CM | POA: Diagnosis not present

## 2022-05-02 DIAGNOSIS — S80212A Abrasion, left knee, initial encounter: Secondary | ICD-10-CM | POA: Diagnosis not present

## 2022-05-02 DIAGNOSIS — S96911A Strain of unspecified muscle and tendon at ankle and foot level, right foot, initial encounter: Secondary | ICD-10-CM | POA: Insufficient documentation

## 2022-05-02 DIAGNOSIS — W109XXA Fall (on) (from) unspecified stairs and steps, initial encounter: Secondary | ICD-10-CM | POA: Insufficient documentation

## 2022-05-02 DIAGNOSIS — W19XXXA Unspecified fall, initial encounter: Secondary | ICD-10-CM

## 2022-05-02 DIAGNOSIS — S99921A Unspecified injury of right foot, initial encounter: Secondary | ICD-10-CM | POA: Diagnosis present

## 2022-05-02 NOTE — ED Provider Notes (Signed)
[facility] EMERGENCY DEPT Provider Note   CSN: [id number] Arrival date & time: 05/02/22  1222     History  Chief Complaint  Patient presents with   Shane Middleton is a 41 y.o. male who presents to the ED complaining of fall onset yesterday. Notes that he was walking when he tripped down a couple of steps and injured his left knee and right foot. Denies hitting his head, LOC, nausea, vomiting, swelling, neck pain, back pain. No blood thinners.    The history is provided by the patient. No language interpreter was used.       Home Medications Prior to Admission medications   Medication Sig Start Date End Date Taking? Authorizing Provider  anastrozole (ARIMIDEX) 1 MG tablet Take 1 mg by mouth daily. 10/14/20   [provider]  b complex vitamins capsule Take 1 capsule by mouth daily.    [provider]  buPROPion (WELLBUTRIN XL) 150 MG 24 hr tablet Take 150 mg by mouth every morning. 11/02/20   [provider]  Cholecalciferol (VITAMIN D3) 125 MCG (5000 UT) CAPS Take 5,000 Units by mouth daily.    [provider]  HYDROcodone-acetaminophen (NORCO) 7.5-325 MG tablet Take 1 tablet by mouth every 6 (six) hours as needed for moderate pain. 11/05/20   Yolonda Kida, MD  methocarbamol (ROBAXIN) 500 MG tablet Take 1 tablet (500 mg total) by mouth every 6 (six) hours as needed for muscle spasms. 11/05/20   Yolonda Kida, MD  milk thistle 175 MG tablet Take 175 mg by mouth daily.    [provider]  Omega-3 Fatty Acids (FISH OIL) 1000 MG CAPS Take 2,000 mg by mouth daily.    [provider]  ondansetron (ZOFRAN ODT) 4 MG disintegrating tablet Take 1 tablet (4 mg total) by mouth every 8 (eight) hours as needed for nausea or vomiting. 11/03/20   Roxy Horseman, PA-C  ondansetron (ZOFRAN ODT) 4 MG disintegrating tablet Take 1 tablet (4 mg total) by mouth every 8 (eight) hours as needed. 11/05/20   Yolonda Kida, MD  testosterone cypionate (DEPOTESTOSTERONE CYPIONATE) 200 MG/ML injection Inject 200 mg into the skin every 14 (fourteen) days. 10/29/20   [provider]  thyroid (ARMOUR) 120 MG tablet Take 120 mg by mouth daily before breakfast.    [provider]      Allergies    Prednisone    Review of Systems   Review of Systems  All other systems reviewed and are negative.   Physical Exam Updated Vital Signs BP (!) 136/97 (BP Location: Right Arm)   Pulse 85   Temp (!) 97.5 F (36.4 C) (Temporal)   Resp 18   Ht 5\' 11"  (1.803 m)   Wt 88.9 kg   SpO2 99%   BMI 27.33 kg/m  Physical Exam Vitals and nursing note reviewed.  Constitutional:      General: He is not in acute distress.    Appearance: Normal appearance.  Eyes:     General: No scleral icterus.    Extraocular Movements: Extraocular movements intact.  Cardiovascular:     Rate and Rhythm: Normal rate.  Pulmonary:     Effort: Pulmonary effort is normal. No respiratory distress.  Musculoskeletal:     Cervical back: Neck supple.     Comments: Tenderness to palpation to fifth metatarsal of right foot.  Mild swelling and ecchymosis noted to the lateral malleolus on the right.  Able to flex and  extend against resistance without difficulty.  Abrasion noted to medial aspect of left knee with mild tenderness to palpation noted to the area.  No obvious swelling or ecchymosis noted to the area.  Able to flex and extend knee against resistance without difficulty.  Skin:    General: Skin is warm and dry.     Findings: No bruising, erythema or rash.  Neurological:     Mental Status: He is alert.  Psychiatric:        Behavior: Behavior normal.     ED Results / Procedures / Treatments   Labs (all labs ordered are listed, but only abnormal results are displayed) Labs Reviewed - No data to display  EKG None  Radiology DG Foot Complete Right  Result Date: 05/02/2022 CLINICAL DATA:  Fall EXAM: RIGHT FOOT  COMPLETE - 3+ VIEW COMPARISON:  None Available. FINDINGS: There is no evidence of fracture or dislocation. There is no evidence of arthropathy or other focal bone abnormality. Soft tissues are unremarkable. IMPRESSION: Negative. Electronically Signed   By: Marlan Palau M.D.   On: 05/02/2022 13:37   DG Knee Complete 4 Views Left  Result Date: 05/02/2022 CLINICAL DATA:  Fall EXAM: LEFT KNEE - COMPLETE 4+ VIEW COMPARISON:  None Available. FINDINGS: No evidence of fracture, dislocation, or joint effusion. No evidence of arthropathy or other focal bone abnormality. Soft tissues are unremarkable. IMPRESSION: Negative. Electronically Signed   By: Marlan Palau M.D.   On: 05/02/2022 13:36    Procedures Procedures    Medications Ordered in ED Medications - No data to display  ED Course/ Medical Decision Making/ A&P                           Medical Decision Making  Pt with fall occurring yesterday. Notes mechanical fall while going down the stairs. Denies hitting their head, LOC, neck pain, back pain, nausea, vomiting. Vital signs pt afebrile. On exam, patient with Tenderness to palpation to fifth metatarsal of right foot.  Mild swelling and ecchymosis noted to the lateral malleolus on the right.  Able to flex and extend against resistance without difficulty. Differential diagnosis includes fracture, dislocation, sprain/strain.    Imaging: I ordered imaging studies including right foot x-ray, left knee x-ray I independently visualized and interpreted imaging which showed: No acute fractures or dislocations I agree with the radiologist interpretation  Disposition: Presentation suspicious of strain.  Doubt fracture or dislocation at this time. After consideration of the diagnostic results and the patients response to treatment, I feel that the patient would benefit from Discharge home.  Patient provided with postop shoe today.  Patient notes that he has crutches in the car.  Provided with  information for on-call sports medicine doctor for follow-up regarding today's ED visit.  Supportive care measures and strict return precautions discussed with patient at bedside.  Appears safe for discharge at this time. Follow up as indicated in discharge paperwork.   This chart was dictated using voice recognition software, Dragon. Despite the best efforts of this provider to proofread and correct errors, errors may still occur which can change documentation meaning.   Final Clinical Impression(s) / ED Diagnoses Final diagnoses:  Fall, initial encounter  Strain of right foot, initial encounter    Rx / DC Orders ED Discharge Orders     None         Quintez Maselli A, PA-C 05/02/22 1545    Ernie Avena, MD 05/02/22 [phone]

## 2022-05-02 NOTE — ED Notes (Signed)
Pt given discharge instructions. Opportunities given for questions. Pt verbalizes understanding. Koston Hennes R, RN 

## 2022-05-02 NOTE — ED Triage Notes (Signed)
Patient here POV from Home.  Endorses Tripping down a few steps yesterday and injuring his Left Knee and Right Foot in the process.   No Ankle Pain. No head Injury. No LOC. No Anticoagulants.   NAD noted during Triage. A&Ox4. GCS 15. BIB Wheelchair.

## 2022-05-02 NOTE — Discharge Instructions (Signed)
It was a pleasure taking care of you!   Your x-ray was negative for fracture or dislocation.  You may take over the counter 600 mg Ibuprofen every 6 hours or 500 mg Tylenol every 6 hours as needed for pain for no more than 7 days.  You will be given a postop shoe, use it with walking.  You may also use the crutches that you have at home to aid with walking.  You may remove the postop shoe at night.  You may apply ice or heat to affected area for up to 15 minutes at a time. Ensure to place a barrier between your skin and the ice/heat.  Attached is information for the on-call sports medicine doctor, you may call to set up a follow-up appointment regarding today's ED visit.  You may follow-up with your primary care provider as needed.  Return to the Emergency Department if you are experiencing increasing/worsening pain, swelling, color change, fever, or worsening symptoms.

## 2022-05-08 ENCOUNTER — Other Ambulatory Visit: Payer: Self-pay | Admitting: Gastroenterology

## 2022-05-08 DIAGNOSIS — R945 Abnormal results of liver function studies: Secondary | ICD-10-CM

## 2022-05-18 ENCOUNTER — Ambulatory Visit
Admission: RE | Admit: 2022-05-18 | Discharge: 2022-05-18 | Disposition: A | Discharge status: Home / Self Care | Payer: 59 | Source: Ambulatory Visit | Attending: Gastroenterology | Admitting: Gastroenterology

## 2022-05-18 DIAGNOSIS — R945 Abnormal results of liver function studies: Secondary | ICD-10-CM

## 2024-05-05 ENCOUNTER — Other Ambulatory Visit (HOSPITAL_BASED_OUTPATIENT_CLINIC_OR_DEPARTMENT_OTHER): Payer: Self-pay | Admitting: Family Medicine

## 2024-05-05 DIAGNOSIS — Z818 Family history of other mental and behavioral disorders: Secondary | ICD-10-CM

## 2024-05-10 ENCOUNTER — Ambulatory Visit (HOSPITAL_BASED_OUTPATIENT_CLINIC_OR_DEPARTMENT_OTHER)
